# Patient Record
Sex: Female | Born: 1970 | Race: Asian | Hispanic: No | Marital: Married | Smoking: Never smoker
Health system: Southern US, Community
[De-identification: ages and names within clinical notes are randomized; demographics above are authoritative.]

---

## 1999-05-26 ENCOUNTER — Other Ambulatory Visit: Admission: RE | Admit: 1999-05-26 | Discharge: 1999-05-26 | Payer: Self-pay | Admitting: Family Medicine

## 2000-02-25 ENCOUNTER — Other Ambulatory Visit: Admission: RE | Admit: 2000-02-25 | Discharge: 2000-02-25 | Payer: Self-pay | Admitting: Obstetrics and Gynecology

## 2000-09-28 ENCOUNTER — Inpatient Hospital Stay (HOSPITAL_COMMUNITY): Admission: AD | Admit: 2000-09-28 | Discharge: 2000-09-30 | Payer: Self-pay | Admitting: Obstetrics and Gynecology

## 2000-10-01 ENCOUNTER — Encounter: Admission: RE | Admit: 2000-10-01 | Discharge: 2000-10-31 | Payer: Self-pay | Admitting: Obstetrics and Gynecology

## 2000-10-18 ENCOUNTER — Encounter (INDEPENDENT_AMBULATORY_CARE_PROVIDER_SITE_OTHER): Payer: Self-pay | Admitting: *Deleted

## 2000-10-18 ENCOUNTER — Other Ambulatory Visit: Admission: RE | Admit: 2000-10-18 | Discharge: 2000-10-18 | Payer: Self-pay | Admitting: Obstetrics and Gynecology

## 2000-10-21 ENCOUNTER — Encounter: Payer: Self-pay | Admitting: Obstetrics and Gynecology

## 2000-10-21 ENCOUNTER — Encounter (INDEPENDENT_AMBULATORY_CARE_PROVIDER_SITE_OTHER): Payer: Self-pay

## 2000-10-21 ENCOUNTER — Inpatient Hospital Stay (HOSPITAL_COMMUNITY): Admission: AD | Admit: 2000-10-21 | Discharge: 2000-10-21 | Payer: Self-pay | Admitting: Obstetrics and Gynecology

## 2000-10-21 ENCOUNTER — Ambulatory Visit (HOSPITAL_COMMUNITY): Admission: RE | Admit: 2000-10-21 | Discharge: 2000-10-21 | Payer: Self-pay | Admitting: Obstetrics and Gynecology

## 2000-10-26 ENCOUNTER — Other Ambulatory Visit: Admission: RE | Admit: 2000-10-26 | Discharge: 2000-10-26 | Payer: Self-pay | Admitting: Obstetrics and Gynecology

## 2000-11-01 ENCOUNTER — Encounter: Admission: RE | Admit: 2000-11-01 | Discharge: 2000-12-01 | Payer: Self-pay | Admitting: Obstetrics and Gynecology

## 2001-09-01 ENCOUNTER — Other Ambulatory Visit: Admission: RE | Admit: 2001-09-01 | Discharge: 2001-09-01 | Payer: Self-pay | Admitting: Obstetrics and Gynecology

## 2002-09-03 ENCOUNTER — Other Ambulatory Visit: Admission: RE | Admit: 2002-09-03 | Discharge: 2002-09-03 | Payer: Self-pay | Admitting: *Deleted

## 2003-09-06 ENCOUNTER — Other Ambulatory Visit: Admission: RE | Admit: 2003-09-06 | Discharge: 2003-09-06 | Payer: Self-pay | Admitting: Obstetrics and Gynecology

## 2006-02-08 ENCOUNTER — Ambulatory Visit (HOSPITAL_COMMUNITY): Admission: RE | Admit: 2006-02-08 | Discharge: 2006-02-08 | Payer: Self-pay | Admitting: Obstetrics and Gynecology

## 2006-02-08 ENCOUNTER — Encounter (INDEPENDENT_AMBULATORY_CARE_PROVIDER_SITE_OTHER): Payer: Self-pay | Admitting: Specialist

## 2008-07-25 ENCOUNTER — Encounter: Payer: Self-pay | Admitting: Internal Medicine

## 2008-08-01 ENCOUNTER — Encounter: Payer: Self-pay | Admitting: Internal Medicine

## 2008-08-07 ENCOUNTER — Ambulatory Visit: Payer: Self-pay | Admitting: Internal Medicine

## 2008-08-07 DIAGNOSIS — K146 Glossodynia: Secondary | ICD-10-CM | POA: Insufficient documentation

## 2008-08-07 DIAGNOSIS — R12 Heartburn: Secondary | ICD-10-CM | POA: Insufficient documentation

## 2008-08-07 DIAGNOSIS — J029 Acute pharyngitis, unspecified: Secondary | ICD-10-CM | POA: Insufficient documentation

## 2008-08-09 ENCOUNTER — Encounter: Payer: Self-pay | Admitting: Internal Medicine

## 2008-10-10 ENCOUNTER — Ambulatory Visit: Payer: Self-pay | Admitting: Internal Medicine

## 2008-10-10 DIAGNOSIS — R1013 Epigastric pain: Secondary | ICD-10-CM | POA: Insufficient documentation

## 2009-08-27 ENCOUNTER — Encounter: Payer: Self-pay | Admitting: Internal Medicine

## 2009-08-28 ENCOUNTER — Encounter: Payer: Self-pay | Admitting: Gastroenterology

## 2010-03-09 IMAGING — CT CT NECK W/O CM
2 series · 10 of 14 positions shown, 12 images · IV contrast (CONTRAST)
Comparison: NONE

CLINICAL DATA: Left-sided neck soreness and swelling. 

CT NECK WITHOUT AND WITH INTRAVENOUS CONTRAST
TECHNIQUE: A series of axial 5-mm-thick slices at 5-mm 
increments were obtained prior to intravenous contrast.  Following 
the intravenous administration of contrast, 3-mm-thick slices at 
3-mm increments were obtained.

[Series 2: without · axial · non-contrast · 0.49mm/px · z∈[-549,-424]mm · 3 of 51 slices shown]
[im 13/51  bone]
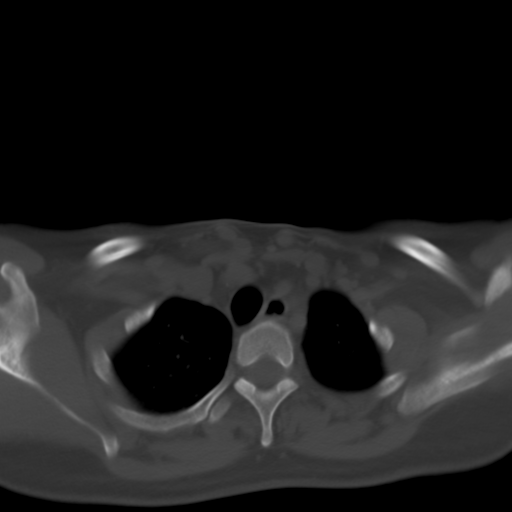
[im 26/51  bone]
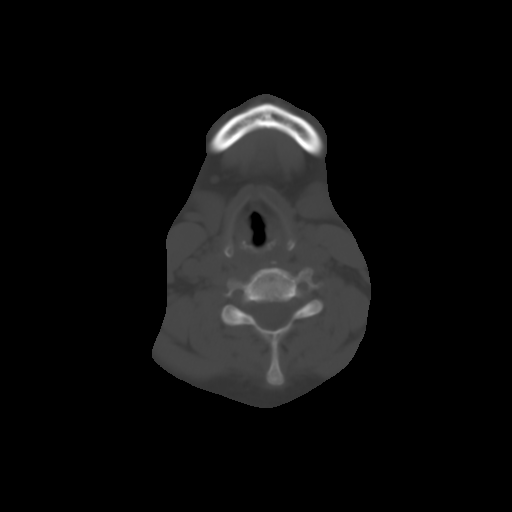
[im 38/51  bone]
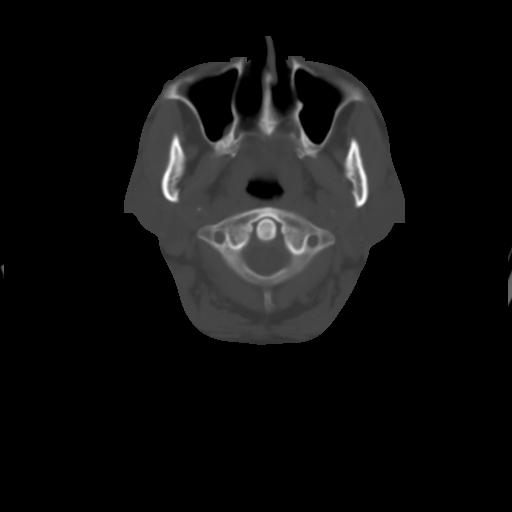

[Series 3: with contrast · axial · 0.49mm/px · z∈[-581,-392]mm · 7 of 85 slices shown, 9 images]
[im 11/85  soft-tissue]
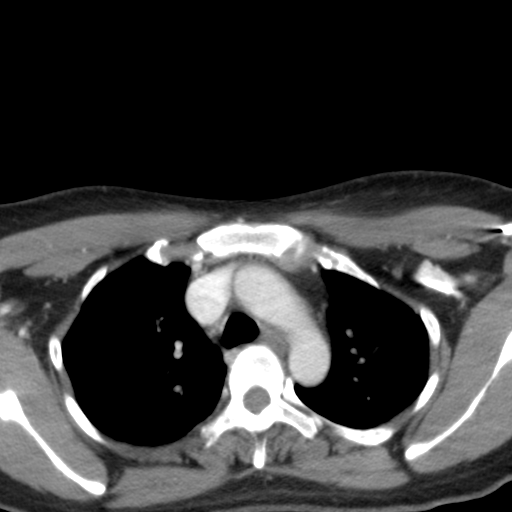
[im 11/85  bone]
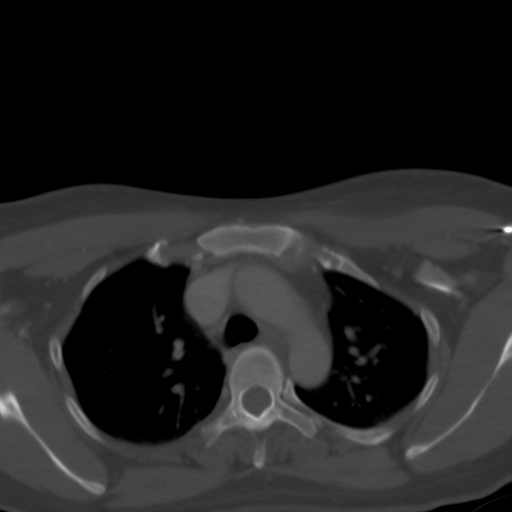
[im 22/85  bone]
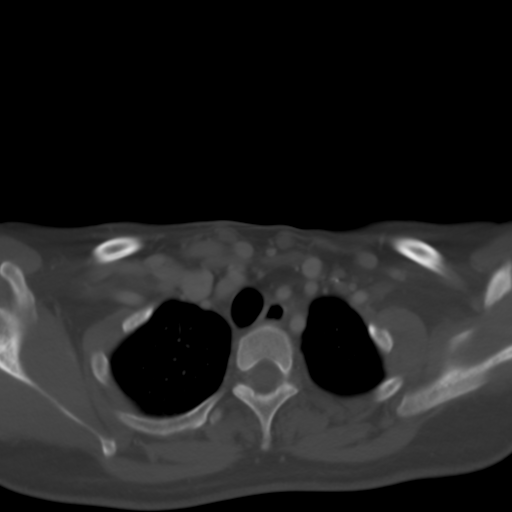
[im 32/85  bone]
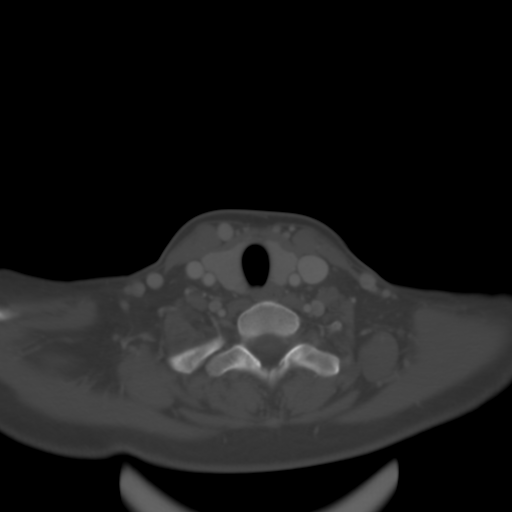
[im 43/85  bone]
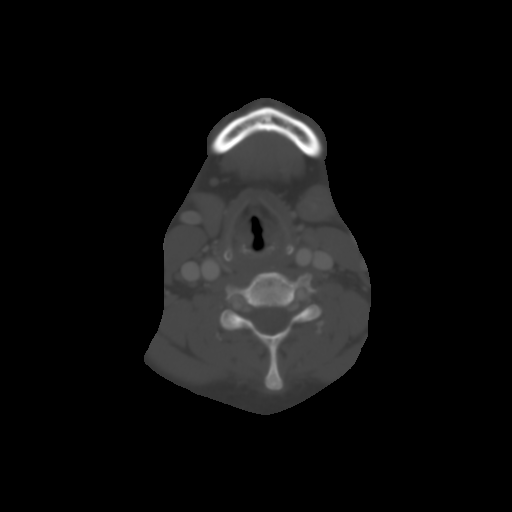
[im 53/85  soft-tissue]
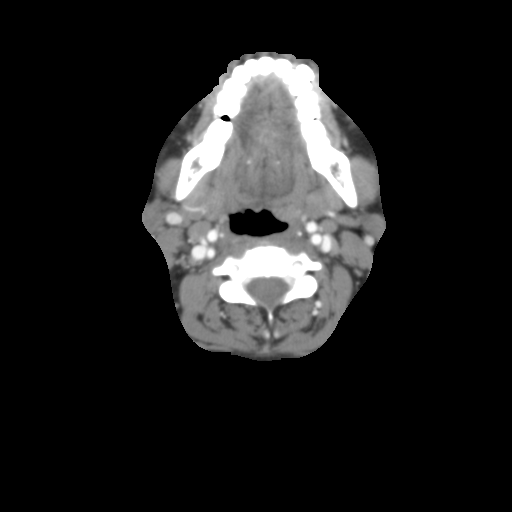
[im 53/85  bone]
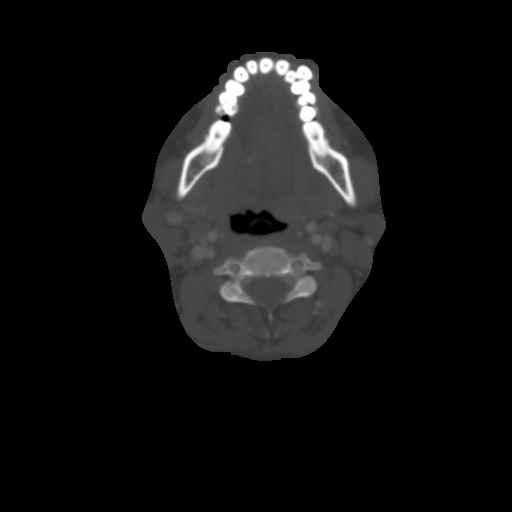
[im 64/85  bone]
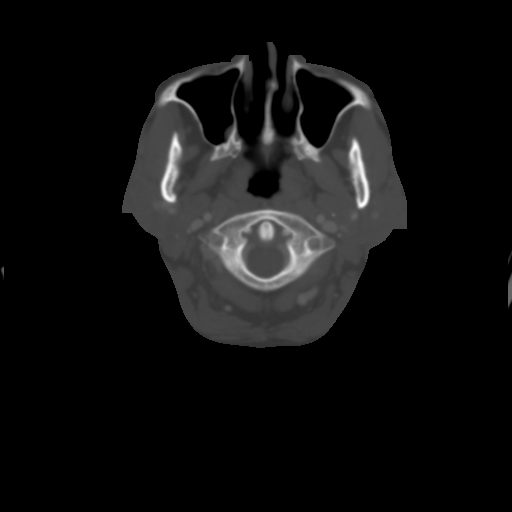
[im 74/85  bone]
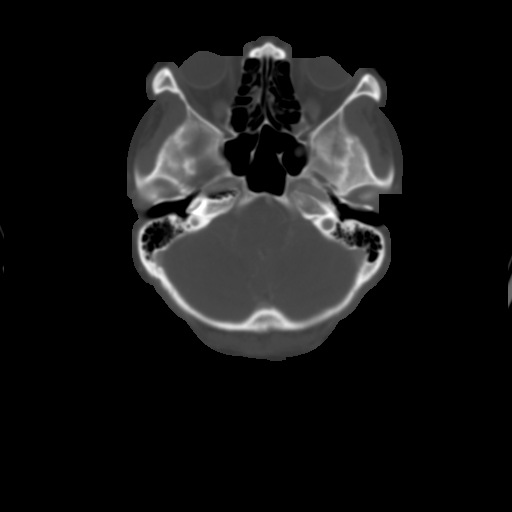

[10 of 14 positions shown; findings below may reference images not displayed]

FINDINGS: No evidence of nasopharyngeal, oropharyngeal, or 
hypopharyngeal mass.  No supraglottic, glottic, or infraglottic 
masses.  No evidence of parotid or submandibular gland mass or 
calculus. There are scattered lymph nodes in the anterior and 
posterior cervical region, most of which are less than 1.0 cm in 
diameter.  There is a lymph node on the right beneath the 
sternocleidomastoid and just in front of the carotid and internal 
jugular veins, which measures approximately 10 mm.  No coalescent 
mass or adenopathy is noted.  Thyroid is unremarkable.  No 
anterior superior mediastinal or apical lung masses.  No lytic or 
blastic lesions are identified.
IMPRESSION: Mild reactive adenopathy in the neck without evidence 
of large mass or coalescent adenopathy. Vance, Manuel 
electronically reviewed on 04/16/2008 Dict Date: 04/16/2008  Tran 
Date:  04/16/2008 DAS  JLM

## 2010-06-09 NOTE — Medication Information (Signed)
Summary: Coverage Review Request for Aciphex/[employer]  Coverage Review Request for Aciphex/[employer]   Imported By: Sherian Rein 09/29/2009 09:19:37  _____________________________________________________________________  External Attachment:    Type:   Image     Comment:   External Document

## 2010-06-09 NOTE — Medication Information (Signed)
Summary: Aciphex Approved/[employer]  Aciphex Approved/[employer]   Imported By: Sherian Rein 09/02/2009 14:24:47  _____________________________________________________________________  External Attachment:    Type:   Image     Comment:   External Document

## 2010-09-25 NOTE — H&P (Signed)
[facility]  PatientVIKA, BUSKE                              MRN: [id number] Adm. Date:  09/28/00 Attending:  Silverio Lay, M.D.                         History and Physical  DATE OF BIRTH:                16-Sep-1970  REASON FOR ADMISSION:         Intrauterine pregnancy at 39 weeks and 5 days and spontaneous labor.  HISTORY OF PRESENT ILLNESS:   This is a 40 year old [place] patient, gravida 1, para 0, abortus 0, with a due date of Sep 30, 2000, being admitted for regular uterine contractions since midnight today.  She is currently contracting every 2 to 3 minutes, lasting 60 seconds, intensity 8/10.  She denies any leaking of fluid, denies any bleeding, reports good fetal activity.  Prenatal course reveals blood type B-positive, RPR nonreactive, rubella immune, HBsAg negative, HIV nonreactive, gonorrhea negative, Chlamydia negative, first trimester ultrasound changed due date to Sep 30, 2000, 16-week AFP within normal limits, 20-week ultrasound with normal anatomy survey, normal anterior placenta, 28-week glucose tolerance test within normal limits and 35-week group B strep is negative.  Prenatal course was otherwise uneventful.  ALLERGIES:                    No known drug allergies.  FAMILY HISTORY:               Father deceased of lung cancer.  SOCIAL HISTORY:               Single.  Father of the baby supportive. Nonsmoker.  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  PHYSICAL EXAMINATION:  VITAL SIGNS:                  Normal.  HEENT:                        Negative.  LUNGS:                        Clear.  HEART:                        Normal.  ABDOMEN:                      Gravid, fundal height 40 cm, vertex presentation by Leopolds maneuver.  PELVIC:                       Vaginal exam 3+ cm, 90% effaced, bulging bag of membrane, vertex -1.  FETAL HEART TRACING:          Now very active and reassuring but at arrival, baby  had a prolonged variable decelerations to 80 beats per minute, which were rapidly resolved with IV bolus and left lateral decubitus.  ASSESSMENT:                   Intrauterine pregnancy at 39 weeks and 5 days, in active labor.  PLAN:  Admit to L&D.  Pain management as needed. Epidural as needed.  Spontaneous vaginal delivery expected. DD:  09/28/00 TD:  09/28/00 Job: [id number]

## 2010-09-25 NOTE — Op Note (Signed)
[facility] of [facility]  PatientJAELYN, Dominique Black                              MRN: [id number] Proc. Date: 10/21/00 Adm. Date:  [id number] Attending:  Silverio Lay A                           Operative Report  PREOPERATIVE DIAGNOSIS:       Possible retained placenta, three weeks postpartum.  POSTOPERATIVE DIAGNOSIS:      Possible retained placenta, three weeks postpartum.  PROCEDURE:                    Dilation and curettage.  SURGEON:                      Silverio Lay, M.D.  ANESTHESIA:                   General with paracervical block, was first intended for MAC.  ESTIMATED BLOOD LOSS:         50 cc.  DESCRIPTION OF PROCEDURE:     After being informed of the planned procedure with possible risks and complications including bleeding, infection, and uterine perforation with the possible need of laparoscopy, and this consent was done in the office with an interpreter, informed consent is obtained. The patient is taken to OR #4 and given MAC sedation. She is placed in a lithotomy position, prepped and draped in a sterile fashion. A speculum is inserted, anterior lip of the cervix is grasped with a tenaculum and paracervical block is done using Nesacaine 1% 10 cc in usual fashion. Due to her great sensitivity to fentanyl and difficulty breathing, anesthesia was converted to general with laryngeal mask. Uterus was sounded at 10.5 cm and the cervix easily let a 35 gauge Pratt dilator go through. Using a large curet, we proceeded with sharp curettage of all uterine walls which removed necrotic-appearing decidua. No frank products of conception, no frank placenta. At the end of the procedure, all walls were felt to be free of tissue and bleeding was minimal. We removed instruments, applied some pressure at the site of the tenaculum clamp, obtained good hemostasis, then removed all instruments. Instrument and sponge count is complete x 2. Estimated blood loss is 50  cc. The procedure is well tolerated by the patient who is taken to recovery room in a well and stable condition. DD:  10/21/00 TD:  10/21/00 Job: [id number]

## 2010-09-25 NOTE — Op Note (Signed)
NAME:  Dominique Black, Dominique Black                     ACCOUNT NO.:  [id number]   MEDICAL RECORD NO.:  [id number]          PATIENT TYPE:  AMB   LOCATION:  [facility]                           FACILITY:  [facility]   PHYSICIAN:  Lenoard Aden, M.D.DATE OF BIRTH:  1970/11/01   DATE OF PROCEDURE:  02/08/2006  DATE OF DISCHARGE:                                 OPERATIVE REPORT   PREOPERATIVE DIAGNOSIS:  Missed abortion at 7 weeks.   POSTOPERATIVE DIAGNOSIS:  Missed abortion at 7 weeks.   PROCEDURE:  Suction dilatation and evacuation.   SURGEON:  Lenoard Aden, M.D.   ANESTHESIA:  MAC and paracervical.   ESTIMATED BLOOD LOSS:  Less than 50 mL.   COMPLICATIONS:  None.   SPECIMENS:  Products of conception to pathology.   DISPOSITION:  Patient to recovery room in good condition.   BRIEF OPERATIVE NOTE:  Being appraised of the risks of anesthesia,  infection, bleeding, injury to abdominal organs, need for repair of delayed  versus immediate complications to include bowel or bladder injury, she is  brought to the operating room where she was administered IV sedation without  difficulty, prepped and draped in the usual sterile fashion, catheterized  until the bladder was empty.  Examination under anesthesia reveals a bulky,  mid position, 6-week size uterus, no adnexal masses.  Paracervical block  using a dilute Xylocaine solution is placed, 20 mL total.  Cervix easily  dilated up to #21 Pratt dilator.  The 7-mm suction curette placed.  Visualization upon suction reveals products of conception which are noted.  Repeat suction and curettage in a 4-quadrant method reveals the cavity to be  empty.  Good hemostasis noted.  All instruments removed.  The patient  tolerated this procedure well and is transferred to recovery in good  condition.      Lenoard Aden, M.D.  Electronically Signed     RJT/MEDQ  D:  02/08/2006  T:  02/09/2006  Job:  [id number]

## 2013-04-09 ENCOUNTER — Ambulatory Visit (INDEPENDENT_AMBULATORY_CARE_PROVIDER_SITE_OTHER): Payer: BC Managed Care – PPO | Admitting: Family Medicine

## 2013-04-09 VITALS — BP 110/60 | HR 60 | Temp 98.3°F | Resp 16 | Ht 60.0 in | Wt 119.2 lb

## 2013-04-09 DIAGNOSIS — H9202 Otalgia, left ear: Secondary | ICD-10-CM

## 2013-04-09 DIAGNOSIS — L299 Pruritus, unspecified: Secondary | ICD-10-CM

## 2013-04-09 DIAGNOSIS — H9209 Otalgia, unspecified ear: Secondary | ICD-10-CM

## 2013-04-09 MED ORDER — NAPROXEN 500 MG PO TABS: 500.0000 mg | ORAL_TABLET | Freq: Two times a day (BID) | ORAL | Status: DC

## 2013-04-09 MED ORDER — NEOMYCIN-POLYMYXIN-HC 3.5-10000-1 OT SOLN: 3.0000 [drp] | Freq: Four times a day (QID) | OTIC | Status: DC

## 2013-04-09 NOTE — Patient Instructions (Signed)
Use 3 drops in your left ear 3 or 4 times daily  Take naproxen 500 mg one twice daily with breakfast and with supper for pain and inflammation  Return if worse or not improving

## 2013-04-09 NOTE — Progress Notes (Signed)
Subjective: 42 year old lady who is well-known to me. She has not been here for a couple of years. She has been having problems for the last week or 2 with itching and a fullness discomfort in her left ear. It is been bothering her a lot. Has not had any upper respiratory infection symptoms.  Objective: Her TMs are entirely normal. Canals look okay. No pain with movement of the pinna. No significant nodes. TMJs intact. Throat clear.  Assessment: Itching and discomfort left ear  Plan: Cortisporin Otic Naproxen for inflammation Return if worse

## 2015-09-04 ENCOUNTER — Ambulatory Visit (INDEPENDENT_AMBULATORY_CARE_PROVIDER_SITE_OTHER): Payer: BLUE CROSS/BLUE SHIELD | Admitting: Family Medicine

## 2015-09-04 ENCOUNTER — Encounter: Payer: Self-pay | Admitting: Family Medicine

## 2015-09-04 VITALS — BP 128/70 | HR 95 | Temp 98.8°F | Ht 60.5 in | Wt 129.0 lb

## 2015-09-04 DIAGNOSIS — R05 Cough: Secondary | ICD-10-CM | POA: Diagnosis not present

## 2015-09-04 DIAGNOSIS — R0989 Other specified symptoms and signs involving the circulatory and respiratory systems: Secondary | ICD-10-CM

## 2015-09-04 DIAGNOSIS — J989 Respiratory disorder, unspecified: Secondary | ICD-10-CM | POA: Diagnosis not present

## 2015-09-04 DIAGNOSIS — T7840XA Allergy, unspecified, initial encounter: Secondary | ICD-10-CM

## 2015-09-04 DIAGNOSIS — R059 Cough, unspecified: Secondary | ICD-10-CM

## 2015-09-04 MED ORDER — BENZONATATE 100 MG PO CAPS
100.0000 mg | ORAL_CAPSULE | Freq: Three times a day (TID) | ORAL | Status: DC | PRN
Start: 2015-09-04 — End: 2017-05-25

## 2015-09-04 MED ORDER — MONTELUKAST SODIUM 10 MG PO TABS: 10.0000 mg | ORAL_TABLET | Freq: Every day | ORAL | Status: DC

## 2015-09-04 MED ORDER — ALBUTEROL SULFATE HFA 108 (90 BASE) MCG/ACT IN AERS: 2.0000 | INHALATION_SPRAY | RESPIRATORY_TRACT | Status: DC | PRN

## 2015-09-04 MED ORDER — BENZONATATE 100 MG PO CAPS: 100.0000 mg | ORAL_CAPSULE | Freq: Three times a day (TID) | ORAL | Status: DC | PRN

## 2015-09-04 NOTE — Progress Notes (Signed)
Patient ID: Dominique Black, female    DOB: [birth year]  Age: 45 y.o. MRN: [id number]  Chief Complaint  Patient presents with  . Cough    x 1 week  . Nasal Congestion  . Shortness of Breath    Subjective:   Pleasant lady who is been having a lot of problems with allergies over the last week or more. She cleaned her garden, and that's when she things really got bad. She has had some headache congestion and a little drainage from the nose. No sore throat or ear pain. No fevers. She has had chest congestion and some coughing. She does not smoke. She works sewing.    Current allergies, medications, problem list, past/family and social histories reviewed.  Objective:  BP 128/70 mmHg  Pulse 95  Temp(Src) 98.8 F (37.1 C) (Oral)  Ht 5' 0.5" (1.537 m)  Wt 129 lb (58.514 kg)  BMI 24.77 kg/m2  SpO2 99%  LMP 08/17/2015 (Exact Date)  No major acute distress. TMs are normal. Nose mildly congested. Throat has some posterior erythema from drainage. Neck supple without significant nodes. Chest is currently clear to auscultation though it sounds like she has had some wheezing. Heart regular without murmurs.  Assessment & Plan:   Assessment: 1. Cough   2. Chest congestion   3. Allergic disorder of respiratory system, initial encounter       Plan: Treat symptomatically. No major testing needed at this time.  No orders of the defined types were placed in this encounter.    Meds ordered this encounter  Medications  . albuterol (PROVENTIL HFA;VENTOLIN HFA) 108 (90 Base) MCG/ACT inhaler    Sig: Inhale 2 puffs into the lungs every 4 (four) hours as needed for wheezing or shortness of breath (cough, shortness of breath or wheezing.).    Dispense:  1 Inhaler    Refill:  1  . montelukast (SINGULAIR) 10 MG tablet    Sig: Take 1 tablet (10 mg total) by mouth at bedtime.    Dispense:  30 tablet    Refill:  3         Patient Instructions   Take montelukast 1 each evening for allergy  Take the  benzonatate cough pills one or 2 pills 3 times daily as needed for daytime cough  Use the albuterol inhaler 2 inhalations every 4-6 hours as needed for congestion in the chest  Also take an over-the-counter loratadine or fexofenadine (Claritin or Allegra) 1 daily  Return if worse or not improving  I am retiring, but other doctors and providers will be here still that you can see when you walk in. Allergic Rhinitis Allergic rhinitis is when the mucous membranes in the nose respond to allergens. Allergens are particles in the air that cause your body to have an allergic reaction. This causes you to release allergic antibodies. Through a chain of events, these eventually cause you to release histamine into the blood stream. Although meant to protect the body, it is this release of histamine that causes your discomfort, such as frequent sneezing, congestion, and an itchy, runny nose.  CAUSES Seasonal allergic rhinitis (hay fever) is caused by pollen allergens that may come from grasses, trees, and weeds. Year-round allergic rhinitis (perennial allergic rhinitis) is caused by allergens such as house dust mites, pet dander, and mold spores. SYMPTOMS  Nasal stuffiness (congestion).  Itchy, runny nose with sneezing and tearing of the eyes. DIAGNOSIS Your health care provider can help you determine the allergen or allergens that  trigger your symptoms. If you and your health care provider are unable to determine the allergen, skin or blood testing may be used. Your health care provider will diagnose your condition after taking your health history and performing a physical exam. Your health care provider may assess you for other related conditions, such as asthma, pink eye, or an ear infection. TREATMENT Allergic rhinitis does not have a cure, but it can be controlled by:  Medicines that block allergy symptoms. These may include allergy shots, nasal sprays, and oral antihistamines.  Avoiding the  allergen. Hay fever may often be treated with antihistamines in pill or nasal spray forms. Antihistamines block the effects of histamine. There are over-the-counter medicines that may help with nasal congestion and swelling around the eyes. Check with your health care provider before taking or giving this medicine. If avoiding the allergen or the medicine prescribed do not work, there are many new medicines your health care provider can prescribe. Stronger medicine may be used if initial measures are ineffective. Desensitizing injections can be used if medicine and avoidance does not work. Desensitization is when a patient is given ongoing shots until the body becomes less sensitive to the allergen. Make sure you follow up with your health care provider if problems continue. HOME CARE INSTRUCTIONS It is not possible to completely avoid allergens, but you can reduce your symptoms by taking steps to limit your exposure to them. It helps to know exactly what you are allergic to so that you can avoid your specific triggers. SEEK MEDICAL CARE IF:  You have a fever.  You develop a cough that does not stop easily (persistent).  You have shortness of breath.  You start wheezing.  Symptoms interfere with normal daily activities.   This information is not intended to replace advice given to you by your health care provider. Make sure you discuss any questions you have with your health care provider.   Document Released: 01/19/2001 Document Revised: 05/17/2014 Document Reviewed: 01/01/2013 Elsevier Interactive Patient Education 2016 [employer].    IF you received an x-ray today, you will receive an invoice from [facility]. Please contact [facility] at [phone] with questions or concerns regarding your invoice.   IF you received labwork today, you will receive an invoice from [employer]. Please contact [facility] at [phone] with questions or  concerns regarding your invoice.   Our billing staff will not be able to assist you with questions regarding bills from these companies.  You will be contacted with the lab results as soon as they are available. The fastest way to get your results is to activate your My Chart account. Instructions are located on the last page of this paperwork. If you have not heard from [place] regarding the results in 2 weeks, please contact this office.          Return if symptoms worsen or fail to improve.   Nashla Althoff, MD 09/04/2015

## 2015-09-04 NOTE — Patient Instructions (Addendum)
Take montelukast 1 each evening for allergy  Take the benzonatate cough pills one or 2 pills 3 times daily as needed for daytime cough  Use the albuterol inhaler 2 inhalations every 4-6 hours as needed for congestion in the chest  Also take an over-the-counter loratadine or fexofenadine or cetirizine (Claritin or Allegra or Zyrtec) 1 daily  Return if worse or not improving  I am retiring, but other doctors and providers will be here still that you can see when you walk in.  Allergic Rhinitis Allergic rhinitis is when the mucous membranes in the nose respond to allergens. Allergens are particles in the air that cause your body to have an allergic reaction. This causes you to release allergic antibodies. Through a chain of events, these eventually cause you to release histamine into the blood stream. Although meant to protect the body, it is this release of histamine that causes your discomfort, such as frequent sneezing, congestion, and an itchy, runny nose.  CAUSES Seasonal allergic rhinitis (hay fever) is caused by pollen allergens that may come from grasses, trees, and weeds. Year-round allergic rhinitis (perennial allergic rhinitis) is caused by allergens such as house dust mites, pet dander, and mold spores. SYMPTOMS  Nasal stuffiness (congestion).  Itchy, runny nose with sneezing and tearing of the eyes. DIAGNOSIS Your health care provider can help you determine the allergen or allergens that trigger your symptoms. If you and your health care provider are unable to determine the allergen, skin or blood testing may be used. Your health care provider will diagnose your condition after taking your health history and performing a physical exam. Your health care provider may assess you for other related conditions, such as asthma, pink eye, or an ear infection. TREATMENT Allergic rhinitis does not have a cure, but it can be controlled by:  Medicines that block allergy symptoms. These may  include allergy shots, nasal sprays, and oral antihistamines.  Avoiding the allergen. Hay fever may often be treated with antihistamines in pill or nasal spray forms. Antihistamines block the effects of histamine. There are over-the-counter medicines that may help with nasal congestion and swelling around the eyes. Check with your health care provider before taking or giving this medicine. If avoiding the allergen or the medicine prescribed do not work, there are many new medicines your health care provider can prescribe. Stronger medicine may be used if initial measures are ineffective. Desensitizing injections can be used if medicine and avoidance does not work. Desensitization is when a patient is given ongoing shots until the body becomes less sensitive to the allergen. Make sure you follow up with your health care provider if problems continue. HOME CARE INSTRUCTIONS It is not possible to completely avoid allergens, but you can reduce your symptoms by taking steps to limit your exposure to them. It helps to know exactly what you are allergic to so that you can avoid your specific triggers. SEEK MEDICAL CARE IF:  You have a fever.  You develop a cough that does not stop easily (persistent).  You have shortness of breath.  You start wheezing.  Symptoms interfere with normal daily activities.   This information is not intended to replace advice given to you by your health care provider. Make sure you discuss any questions you have with your health care provider.   Document Released: 01/19/2001 Document Revised: 05/17/2014 Document Reviewed: 01/01/2013 Elsevier Interactive Patient Education [employer]2016 Elsevier Inc.    IF you received an x-ray today, you will receive an invoice from  [facility]. Please contact [facility] at [phone] with questions or concerns regarding your invoice.   IF you received labwork today, you will receive an invoice from [employer]. Please contact [facility] at [phone] with questions or concerns regarding your invoice.   Our billing staff will not be able to assist you with questions regarding bills from these companies.  You will be contacted with the lab results as soon as they are available. The fastest way to get your results is to activate your My Chart account. Instructions are located on the last page of this paperwork. If you have not heard from [place] regarding the results in 2 weeks, please contact this office.

## 2016-08-04 ENCOUNTER — Emergency Department (HOSPITAL_COMMUNITY): Payer: BLUE CROSS/BLUE SHIELD

## 2016-08-04 ENCOUNTER — Encounter (HOSPITAL_COMMUNITY): Payer: Self-pay | Admitting: Emergency Medicine

## 2016-08-04 ENCOUNTER — Emergency Department (HOSPITAL_COMMUNITY)
Admission: EM | Admit: 2016-08-04 | Discharge: 2016-08-04 | Disposition: A | Discharge status: Home / Self Care | Payer: BLUE CROSS/BLUE SHIELD | Attending: Emergency Medicine | Admitting: Emergency Medicine

## 2016-08-04 DIAGNOSIS — M542 Cervicalgia: Secondary | ICD-10-CM | POA: Insufficient documentation

## 2016-08-04 DIAGNOSIS — Y939 Activity, unspecified: Secondary | ICD-10-CM | POA: Insufficient documentation

## 2016-08-04 DIAGNOSIS — G44319 Acute post-traumatic headache, not intractable: Secondary | ICD-10-CM | POA: Diagnosis not present

## 2016-08-04 DIAGNOSIS — G44309 Post-traumatic headache, unspecified, not intractable: Secondary | ICD-10-CM

## 2016-08-04 DIAGNOSIS — Y92410 Unspecified street and highway as the place of occurrence of the external cause: Secondary | ICD-10-CM | POA: Insufficient documentation

## 2016-08-04 DIAGNOSIS — Y999 Unspecified external cause status: Secondary | ICD-10-CM | POA: Diagnosis not present

## 2016-08-04 MED ORDER — IBUPROFEN 200 MG PO TABS
600.0000 mg | ORAL_TABLET | Freq: Once | ORAL | Status: DC
Start: 2016-08-04 — End: 2016-08-04
  Filled 2016-08-04: qty 3

## 2016-08-04 MED ORDER — IBUPROFEN 800 MG PO TABS: 800.0000 mg | ORAL_TABLET | Freq: Three times a day (TID) | ORAL | 0 refills | Status: DC

## 2016-08-04 MED ORDER — METHOCARBAMOL 500 MG PO TABS: 500.0000 mg | ORAL_TABLET | Freq: Two times a day (BID) | ORAL | 0 refills | Status: DC
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
## 2016-08-04 NOTE — ED Notes (Signed)
Bed: WTR8 Expected date:  Expected time:  Means of arrival:  Comments: 
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
## 2016-08-04 NOTE — Discharge Instructions (Signed)
Return if any problems.  Schedule to see the Orthopaedist for evaluation if pain persist

## 2016-09-29 ENCOUNTER — Other Ambulatory Visit: Payer: Self-pay | Admitting: Family Medicine

## 2016-09-29 DIAGNOSIS — R059 Cough, unspecified: Secondary | ICD-10-CM

## 2016-09-29 DIAGNOSIS — J989 Respiratory disorder, unspecified: Secondary | ICD-10-CM

## 2016-09-29 DIAGNOSIS — R05 Cough: Secondary | ICD-10-CM

## 2016-09-29 DIAGNOSIS — R0989 Other specified symptoms and signs involving the circulatory and respiratory systems: Secondary | ICD-10-CM

## 2016-09-29 DIAGNOSIS — T7840XA Allergy, unspecified, initial encounter: Secondary | ICD-10-CM

## 2017-05-18 ENCOUNTER — Ambulatory Visit: Payer: BLUE CROSS/BLUE SHIELD | Admitting: Physician Assistant

## 2017-05-18 ENCOUNTER — Encounter: Payer: Self-pay | Admitting: Physician Assistant

## 2017-05-18 VITALS — BP 128/86 | HR 93 | Temp 98.4°F | Resp 16 | Ht 61.0 in | Wt 134.0 lb

## 2017-05-18 DIAGNOSIS — J3489 Other specified disorders of nose and nasal sinuses: Secondary | ICD-10-CM

## 2017-05-18 MED ORDER — MUPIROCIN 2 % EX OINT: 1.0000 "application " | TOPICAL_OINTMENT | Freq: Two times a day (BID) | CUTANEOUS | 1 refills | Status: DC

## 2017-05-18 MED ORDER — CLINDAMYCIN HCL 300 MG PO CAPS: 300.0000 mg | ORAL_CAPSULE | Freq: Three times a day (TID) | ORAL | 0 refills | Status: DC

## 2017-05-18 NOTE — Patient Instructions (Addendum)
Please take medication as prescribed, and apply the topical cream.   Impetigo, Adult Impetigo is an infection of the skin. It commonly occurs in young children, but it can also occur in adults. The infection causes itchy blisters and sores that produce brownish-yellow fluid. As the fluid dries, it forms a thick, honey-colored crust. These skin changes usually occur on the face but can also affect other areas of the body. Impetigo usually goes away in 7-10 days with treatment. What are the causes? Impetigo is caused by two types of bacteria. It may be caused by staphylococci or streptococci bacteria. These bacteria cause impetigo when they get under the surface of the skin. This often happens after some damage to the skin, such as damage from:  Cuts, scrapes, or scratches.  Insect bites, especially when you scratch the area of a bite.  Chickenpox or other illnesses that cause open skin sores.  Nail biting or chewing.  Impetigo is contagious and can spread easily from one person to another. This may occur through close skin contact or by sharing towels, clothing, or other items with a person who has the infection. What increases the risk? Some things that can increase the risk of getting this infection include:  Playing sports that include skin-to-skin contact with others.  Having a skin condition with open sores.  Having many skin cuts or scrapes.  Living in an area that has high humidity levels.  Having poor hygiene.  Having high levels of staphylococci in your nose.  What are the signs or symptoms? Impetigo usually starts out as small blisters, often on the face. The blisters then break open and turn into tiny sores (lesions) with a yellow crust. In some cases, the blisters cause itching or burning. With scratching, irritation, or lack of treatment, these small lesions may get larger. Scratching can also cause impetigo to spread to other parts of the body. The bacteria can get under  the fingernails and spread when you touch another area of your skin. Other possible symptoms include:  Larger blisters.  Pus.  Swollen lymph glands.  How is this diagnosed? This condition is usually diagnosed during a physical exam. A skin sample or sample of fluid from a blister may be taken for lab tests that involve growing bacteria (culture test). This can help confirm the diagnosis or help determine the best treatment. How is this treated? Mild impetigo can be treated with prescription antibiotic cream. Oral antibiotic medicine may be used in more severe cases. Medicines for itching may also be used. Follow these instructions at home:  Take medicines only as directed by your health care provider.  To help prevent impetigo from spreading to other body areas: ? Keep your fingernails short and clean. ? Do not scratch the blisters or sores. ? Cover infected areas, if necessary, to keep from scratching.  Gently wash the infected areas with antibiotic soap and water.  Soak crusted areas in warm, soapy water using antibiotic soap. ? Gently rub the areas to remove crusts. Do not scrub.  Wash your hands often to avoid spreading this infection.  Stay home until you have used an antibiotic cream for 48 hours (2 days) or an oral antibiotic medicine for 24 hours (1 day). You should only return to work and activities with other people if your skin shows significant improvement. How is this prevented? To keep the infection from spreading:  Stay home until you have used an antibiotic cream for 48 hours or an oral antibiotic for  24 hours.  Wash your hands often.  Do not engage in skin-to-skin contact with other people while you have still have blisters.  Do not share towels, washcloths, or bedding with others while you have the infection.  Contact a health care provider if:  You develop more blisters or sores despite treatment.  Other family members get sores.  Your skin sores are  not improving after 48 hours of treatment.  You have a fever. Get help right away if:  You see spreading redness or swelling of the skin around your sores.  You see red streaks coming from your sores.  You develop a sore throat. This information is not intended to replace advice given to you by your health care provider. Make sure you discuss any questions you have with your health care provider. Document Released: 05/17/2014 Document Revised: 10/02/2015 Document Reviewed: 04/09/2014 Elsevier Interactive Patient Education  2017 [employer]     IF you received an x-ray today, you will receive an invoice from [facility]. Please contact [facility] at [phone] with questions or concerns regarding your invoice.   IF you received labwork today, you will receive an invoice from [facility]. Please contact [facility] at [phone] with questions or concerns regarding your invoice.   Our billing staff will not be able to assist you with questions regarding bills from these companies.  You will be contacted with the lab results as soon as they are available. The fastest way to get your results is to activate your My Chart account. Instructions are located on the last page of this paperwork. If you have not heard from us regarding the results in 2 weeks, please contact this office.
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
## 2017-05-25 ENCOUNTER — Other Ambulatory Visit: Payer: Self-pay

## 2017-05-25 ENCOUNTER — Ambulatory Visit: Payer: BLUE CROSS/BLUE SHIELD | Admitting: Physician Assistant

## 2017-05-25 ENCOUNTER — Encounter: Payer: Self-pay | Admitting: Physician Assistant

## 2017-05-25 VITALS — BP 118/82 | HR 86 | Temp 98.2°F | Resp 16 | Ht 62.6 in | Wt 135.0 lb

## 2017-05-25 DIAGNOSIS — J3489 Other specified disorders of nose and nasal sinuses: Secondary | ICD-10-CM | POA: Diagnosis not present

## 2017-05-25 MED ORDER — CLINDAMYCIN HCL 300 MG PO CAPS: 300.0000 mg | ORAL_CAPSULE | Freq: Three times a day (TID) | ORAL | 0 refills | Status: AC
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
## 2017-05-25 NOTE — Patient Instructions (Addendum)
  Continue the clindamycin and the mupirocin ointment You can wash this area with soap and water.  You can use antibacterial soap or a gold dial soap.  However stray away from deodorant soap or highly fragranted soaps  IF you received an x-ray today, you will receive an invoice from [facility]. Please contact [facility] at [phone] with questions or concerns regarding your invoice.   IF you received labwork today, you will receive an invoice from [facility]. Please contact [facility] at [phone] with questions or concerns regarding your invoice.   Our billing staff will not be able to assist you with questions regarding bills from these companies.  You will be contacted with the lab results as soon as they are available. The fastest way to get your results is to activate your My Chart account. Instructions are located on the last page of this paperwork. If you have not heard from us regarding the results in 2 weeks, please contact this office.

## 2017-08-17 ENCOUNTER — Encounter: Payer: Self-pay | Admitting: Physician Assistant

## 2019-07-15 ENCOUNTER — Ambulatory Visit: Payer: BLUE CROSS/BLUE SHIELD | Attending: Internal Medicine

## 2019-07-15 DIAGNOSIS — Z23 Encounter for immunization: Secondary | ICD-10-CM | POA: Insufficient documentation

## 2019-07-15 NOTE — Progress Notes (Signed)
   Covid-19 Vaccination Clinic  Name:  Dominique Black    MRN: [id number] DOB: 05-04-1971  07/15/2019  Dominique Black was observed post Covid-19 immunization for 15 minutes without incident. She was provided with Vaccine Information Sheet and instruction to access the V-Safe system.   Dominique Black was instructed to call 911 with any severe reactions post vaccine: Marland Kitchen Difficulty breathing  . Swelling of face and throat  . A fast heartbeat  . A bad rash all over body  . Dizziness and weakness   Immunizations Administered    Name Date Dose VIS Date Route   Pfizer COVID-19 Vaccine 07/15/2019  5:15 PM 0.3 mL 04/20/2019 Intramuscular   Manufacturer: [employer], [employer]   Lot: [id number]   NDC: [address]-8

## 2019-08-14 ENCOUNTER — Ambulatory Visit: Payer: BLUE CROSS/BLUE SHIELD | Attending: Internal Medicine

## 2019-08-14 DIAGNOSIS — Z23 Encounter for immunization: Secondary | ICD-10-CM

## 2019-08-14 NOTE — Progress Notes (Signed)
   Covid-19 Vaccination Clinic  Name:  Dominique Black    MRN: [id number] DOB: Jun 09, 1970  08/14/2019  Ms. [name] was observed post Covid-19 immunization for 15 minutes without incident. She was provided with Vaccine Information Sheet and instruction to access the V-Safe system.   Ms. [name] was instructed to call 911 with any severe reactions post vaccine: Marland Kitchen Difficulty breathing  . Swelling of face and throat  . A fast heartbeat  . A bad rash all over body  . Dizziness and weakness   Immunizations Administered    Name Date Dose VIS Date Route   Pfizer COVID-19 Vaccine 08/14/2019  2:11 PM 0.3 mL 04/20/2019 Intramuscular   Manufacturer: [employer], [employer]   Lot: [id number]   NDC: [address]-0

## 2020-07-04 LAB — EXTERNAL GENERIC LAB PROCEDURE: COLOGUARD: POSITIVE — AB

## 2022-02-24 ENCOUNTER — Ambulatory Visit (HOSPITAL_COMMUNITY)
Admission: EM | Admit: 2022-02-24 | Discharge: 2022-02-24 | Disposition: A | Discharge status: Home / Self Care | Payer: Commercial Managed Care - HMO | Attending: Emergency Medicine | Admitting: Emergency Medicine

## 2022-02-24 ENCOUNTER — Encounter (HOSPITAL_COMMUNITY): Payer: Self-pay | Admitting: Emergency Medicine

## 2022-02-24 DIAGNOSIS — J989 Respiratory disorder, unspecified: Secondary | ICD-10-CM | POA: Diagnosis not present

## 2022-02-24 DIAGNOSIS — R0989 Other specified symptoms and signs involving the circulatory and respiratory systems: Secondary | ICD-10-CM

## 2022-02-24 DIAGNOSIS — T7840XA Allergy, unspecified, initial encounter: Secondary | ICD-10-CM | POA: Diagnosis not present

## 2022-02-24 DIAGNOSIS — J302 Other seasonal allergic rhinitis: Secondary | ICD-10-CM | POA: Diagnosis not present

## 2022-02-24 DIAGNOSIS — R059 Cough, unspecified: Secondary | ICD-10-CM

## 2022-02-24 MED ORDER — CETIRIZINE HCL 10 MG PO TABS: 10.0000 mg | ORAL_TABLET | Freq: Every day | ORAL | 0 refills | Status: DC

## 2022-02-24 MED ORDER — AZITHROMYCIN 250 MG PO TABS: 250.0000 mg | ORAL_TABLET | Freq: Every day | ORAL | 0 refills | Status: DC

## 2022-02-24 MED ORDER — ALBUTEROL SULFATE HFA 108 (90 BASE) MCG/ACT IN AERS: 2.0000 | INHALATION_SPRAY | RESPIRATORY_TRACT | 1 refills | Status: DC | PRN

## 2022-02-24 NOTE — Discharge Instructions (Signed)
Your symptoms are most likely related to your seasonal allergies however as they have been present for 2 weeks we will ensure that there is no bacterial aiding to your symptoms  Take azithromycin as directed for treatment of bacteria  Given Zyrtec every evening before bed to help alleviate some of your congestion  You may take 2 puffs of albuterol inhaler every 4 hours as needed for sensation of shortness of breath and to make it easier for you to breathe  You may continue use of Mucinex and nasal spray to help aid your symptoms    You can take Tylenol and/or Ibuprofen as needed for fever reduction and pain relief.  May Attempt any of the following if new symptoms occur   For cough: honey 1/2 to 1 teaspoon (you can dilute the honey in water or another fluid).  You can also use guaifenesin and dextromethorphan for cough. You can use a humidifier for chest congestion and cough.  If you don't have a humidifier, you can sit in the bathroom with the hot shower running.      For sore throat: try warm salt water gargles, cepacol lozenges, throat spray, warm tea or water with lemon/honey, popsicles or ice, or OTC cold relief medicine for throat discomfort.   For congestion: take a daily anti-histamine like Zyrtec, Claritin, and a oral decongestant, such as pseudoephedrine.  You can also use Flonase 1-2 sprays in each nostril daily.   It is important to stay hydrated: drink plenty of fluids (water, gatorade/powerade/pedialyte, juices, or teas) to keep your throat moisturized and help further relieve irritation/discomfort.

## 2022-02-24 NOTE — ED Triage Notes (Signed)
Pt reports chest congestion and nasal congestion x 2 weeks. States she had a cold first and after the cold resolved the congestion started. Has been taking Mucinex and sinex nasal spray.

## 2022-02-24 NOTE — ED Provider Notes (Signed)
[facility]    CSN: [id number] Arrival date & time: 02/24/22  1613      History   Chief Complaint Chief Complaint  Patient presents with   Nasal Congestion   Chest congestion    HPI Dominique Black is a 51 y.o. female.   Patient presents with nasal congestion, rhinorrhea, chest congestion and a mild nonproductive cough for 2 weeks.  Endorses she feels her right her allergies have flared as she has had similar symptoms approximately 6 years ago.  Has attempted use of Mucinex and Synex nasal spray which has been minimally helpful.  Denies fever, chills, body aches, ear pain, sore throat, shortness of breath or wheezing.  Tolerating food and liquids.  History of GERD.   History reviewed. No pertinent past medical history.  Patient Active Problem List   Diagnosis Date Noted   EPIGASTRIC PAIN 10/10/2008   SORE THROAT 08/07/2008   GLOSSODYNIA 08/07/2008   HEARTBURN 08/07/2008    History reviewed. No pertinent surgical history.  OB History   No obstetric history on file.      Home Medications    Prior to Admission medications   Medication Sig Start Date End Date Taking? Authorizing Provider  acetaminophen (TYLENOL) 325 MG tablet Take 650 mg by mouth every 6 (six) hours as needed.    [provider]  albuterol (PROVENTIL HFA;VENTOLIN HFA) 108 (90 Base) MCG/ACT inhaler Inhale 2 puffs into the lungs every 4 (four) hours as needed for wheezing or shortness of breath (cough, shortness of breath or wheezing.). 09/04/15   Posey Boyer, MD  ibuprofen (ADVIL,MOTRIN) 800 MG tablet Take 1 tablet (800 mg total) by mouth 3 (three) times daily. 08/04/16   Fransico Meadow, PA-C  methocarbamol (ROBAXIN) 500 MG tablet Take 1 tablet (500 mg total) by mouth 2 (two) times daily. 08/04/16   Fransico Meadow, PA-C  montelukast (SINGULAIR) 10 MG tablet TAKE 1 TABLET (10 MG TOTAL) BY MOUTH AT BEDTIME. 09/30/16   Shawnee Knapp, MD  mupirocin ointment (BACTROBAN) 2 % Apply 1 application  topically 2 (two) times daily. 05/18/17   Joretta Bachelor, PA    Family History History reviewed. No pertinent family history.  Social History Social History   Tobacco Use   Smoking status: Never   Smokeless tobacco: Never  Substance Use Topics   Alcohol use: No   Drug use: No     Allergies   Doxycycline, Naproxen, and Penicillins   Review of Systems Review of Systems  Constitutional: Negative.   HENT:  Positive for congestion and rhinorrhea. Negative for dental problem, drooling, ear discharge, ear pain, facial swelling, hearing loss, mouth sores, nosebleeds, postnasal drip, sinus pressure, sinus pain, sneezing, sore throat, tinnitus, trouble swallowing and voice change.   Respiratory:  Positive for cough. Negative for apnea, choking, chest tightness, shortness of breath, wheezing and stridor.   Cardiovascular: Negative.   Gastrointestinal: Negative.   Skin: Negative.   Neurological: Negative.      Physical Exam Triage Vital Signs ED Triage Vitals [02/24/22 1751]  Enc Vitals Group     BP (!) 156/85     Pulse Rate 92     Resp 18     Temp 98.1 F (36.7 C)     Temp Source Oral     SpO2 99 %     Weight      Height      Head Circumference      Peak Flow  Pain Score 0     Pain Loc      Pain Edu?      Excl. in [place]?    No data found.  Updated Vital Signs BP (!) 156/85 (BP Location: Right Arm)   Pulse 92   Temp 98.1 F (36.7 C) (Oral)   Resp 18   SpO2 99%   Visual Acuity Right Eye Distance:   Left Eye Distance:   Bilateral Distance:    Right Eye Near:   Left Eye Near:    Bilateral Near:     Physical Exam Constitutional:      Appearance: Normal appearance.  HENT:     Right Ear: Tympanic membrane, ear canal and external ear normal.     Left Ear: Tympanic membrane, ear canal and external ear normal.     Nose: Congestion and rhinorrhea present.     Mouth/Throat:     Mouth: Mucous membranes are moist.     Pharynx: Oropharynx is clear.  Eyes:      Extraocular Movements: Extraocular movements intact.  Cardiovascular:     Rate and Rhythm: Normal rate and regular rhythm.     Pulses: Normal pulses.     Heart sounds: Normal heart sounds.  Pulmonary:     Effort: Pulmonary effort is normal.     Breath sounds: Normal breath sounds.  Skin:    General: Skin is warm and dry.  Neurological:     Mental Status: She is alert and oriented to person, place, and time. Mental status is at baseline.  Psychiatric:        Mood and Affect: Mood normal.        Behavior: Behavior normal.      UC Treatments / Results  Labs (all labs ordered are listed, but only abnormal results are displayed) Labs Reviewed - No data to display  EKG   Radiology No results found.  Procedures Procedures (including critical care time)  Medications Ordered in UC Medications - No data to display  Initial Impression / Assessment and Plan / UC Course  I have reviewed the triage vital signs and the nursing notes.  Pertinent labs & imaging results that were available during my care of the patient were reviewed by me and considered in my medical decision making (see chart for details).  Seasonal allergies  Vital signs are stable and patient is in no signs of distress nor toxic appearing, congestion is noted with thin the nasal turbinates, exam otherwise unremarkable, lungs are clear and O2 saturation is 99% on room air, symptoms have been present for 2 weeks and we are in peak cold and flu season we will provide bacterial coverage, Z-Pak prescribed as well as prescribing cetirizine and albuterol inhaler as she endorses success with this in the past, may continue use of over-the-counter medications for additional support with follow-up as needed Final Clinical Impressions(s) / UC Diagnoses   Final diagnoses:  None   Discharge Instructions   None    ED Prescriptions   None    PDMP not reviewed this encounter.   Hans Eden, NP 02/24/22 1812

## 2022-08-16 DIAGNOSIS — Z1321 Encounter for screening for nutritional disorder: Secondary | ICD-10-CM | POA: Diagnosis not present

## 2022-08-16 DIAGNOSIS — Z124 Encounter for screening for malignant neoplasm of cervix: Secondary | ICD-10-CM | POA: Diagnosis not present

## 2022-08-16 DIAGNOSIS — Z113 Encounter for screening for infections with a predominantly sexual mode of transmission: Secondary | ICD-10-CM | POA: Diagnosis not present

## 2022-08-16 DIAGNOSIS — Z01419 Encounter for gynecological examination (general) (routine) without abnormal findings: Secondary | ICD-10-CM | POA: Diagnosis not present

## 2022-08-16 DIAGNOSIS — Z01411 Encounter for gynecological examination (general) (routine) with abnormal findings: Secondary | ICD-10-CM | POA: Diagnosis not present

## 2023-03-12 ENCOUNTER — Ambulatory Visit (HOSPITAL_COMMUNITY)
Admission: EM | Admit: 2023-03-12 | Discharge: 2023-03-12 | Disposition: A | Discharge status: Home / Self Care | Payer: 59 | Attending: Emergency Medicine | Admitting: Emergency Medicine

## 2023-03-12 ENCOUNTER — Encounter (HOSPITAL_COMMUNITY): Payer: Self-pay | Admitting: Emergency Medicine

## 2023-03-12 DIAGNOSIS — J029 Acute pharyngitis, unspecified: Secondary | ICD-10-CM

## 2023-03-12 LAB — POCT RAPID STREP A (OFFICE): Rapid Strep A Screen: NEGATIVE

## 2023-03-12 MED ORDER — LIDOCAINE VISCOUS HCL 2 % MT SOLN: 15.0000 mL | OROMUCOSAL | 0 refills | Status: DC | PRN

## 2023-03-12 NOTE — Discharge Instructions (Addendum)
The strep test was negative I believe you have a virus, which is treated with symptomatic care Continue tylenol every 4-6 hours Drink lots of fluids Try salt water gargles, throat lozenges, or throat spray The lidocaine gargle and spit can be used as often as needed to numb the throat It might take a few more days for symptoms to resolve

## 2023-03-12 NOTE — ED Provider Notes (Signed)
[facility]    CSN: [id number] Arrival date & time: 03/12/23  1002      History   Chief Complaint Chief Complaint  Patient presents with   Sore Throat    HPI Lisabeth Mian is a 52 y.o. female.  About a week of sore throat with worsening symptom in the last 3 days. Rating pain 8/10 with swallowing Not having fever, congestion, cough Tried tylenol for a few days. Avoids NSAIDs due to stomach upset Unknown sick contacts at work  History reviewed. No pertinent past medical history.  Patient Active Problem List   Diagnosis Date Noted   EPIGASTRIC PAIN 10/10/2008   SORE THROAT 08/07/2008   GLOSSODYNIA 08/07/2008   HEARTBURN 08/07/2008    History reviewed. No pertinent surgical history.  OB History   No obstetric history on file.      Home Medications    Prior to Admission medications   Medication Sig Start Date End Date Taking? Authorizing Provider  lidocaine (XYLOCAINE) 2 % solution Use as directed 15 mLs in the mouth or throat every 3 (three) hours as needed for mouth pain. Swish/gargle and spit out 03/12/23  Yes Darriona Dehaas, Lurena Joiner, PA-C  acetaminophen (TYLENOL) 325 MG tablet Take 650 mg by mouth every 6 (six) hours as needed.    [provider]  albuterol (VENTOLIN HFA) 108 (90 Base) MCG/ACT inhaler Inhale 2 puffs into the lungs every 4 (four) hours as needed for wheezing or shortness of breath (cough, shortness of breath or wheezing.). 02/24/22   White, Elita Boone, NP  cetirizine (ZYRTEC ALLERGY) 10 MG tablet Take 1 tablet (10 mg total) by mouth daily. 02/24/22   White, Elita Boone, NP  montelukast (SINGULAIR) 10 MG tablet TAKE 1 TABLET (10 MG TOTAL) BY MOUTH AT BEDTIME. 09/30/16   Sherren Mocha, MD  mupirocin ointment (BACTROBAN) 2 % Apply 1 application topically 2 (two) times daily. 05/18/17   Garnetta Buddy, PA    Family History History reviewed. No pertinent family history.  Social History Social History   Tobacco Use   Smoking status: Never    Smokeless tobacco: Never  Substance Use Topics   Alcohol use: No   Drug use: No     Allergies   Doxycycline, Naproxen, and Penicillins   Review of Systems Review of Systems Per HPI  Physical Exam Triage Vital Signs ED Triage Vitals  Encounter Vitals Group     BP 03/12/23 1019 135/83     Systolic BP Percentile --      Diastolic BP Percentile --      Pulse Rate 03/12/23 1019 79     Resp 03/12/23 1019 16     Temp 03/12/23 1019 98.2 F (36.8 C)     Temp Source 03/12/23 1019 Oral     SpO2 03/12/23 1019 98 %     Weight --      Height --      Head Circumference --      Peak Flow --      Pain Score 03/12/23 1017 8     Pain Loc --      Pain Education --      Exclude from Growth Chart --    No data found.  Updated Vital Signs BP 135/83 (BP Location: Left Arm)   Pulse 79   Temp 98.2 F (36.8 C) (Oral)   Resp 16   LMP 07/21/2016   SpO2 98%   Visual Acuity Right Eye Distance:   Left Eye Distance:  Bilateral Distance:    Right Eye Near:   Left Eye Near:    Bilateral Near:     Physical Exam Vitals and nursing note reviewed.  Constitutional:      General: She is not in acute distress.    Appearance: She is not ill-appearing.  HENT:     Right Ear: Tympanic membrane and ear canal normal.     Left Ear: Tympanic membrane and ear canal normal.     Nose: No congestion or rhinorrhea.     Mouth/Throat:     Mouth: Mucous membranes are moist.     Pharynx: Oropharynx is clear. Uvula midline. Posterior oropharyngeal erythema (mild) present. No pharyngeal swelling or oropharyngeal exudate.     Comments: No tonsils  Cardiovascular:     Rate and Rhythm: Normal rate and regular rhythm.     Heart sounds: Normal heart sounds.  Pulmonary:     Effort: Pulmonary effort is normal.     Breath sounds: Normal breath sounds.  Abdominal:     Palpations: Abdomen is soft.     Tenderness: There is no abdominal tenderness.  Musculoskeletal:     Cervical back: Normal range of motion.  No rigidity.  Lymphadenopathy:     Cervical: No cervical adenopathy.  Skin:    General: Skin is warm and dry.  Neurological:     Mental Status: She is alert and oriented to person, place, and time.     UC Treatments / Results  Labs (all labs ordered are listed, but only abnormal results are displayed) Labs Reviewed  POCT RAPID STREP A (OFFICE)    EKG   Radiology No results found.  Procedures Procedures (including critical care time)  Medications Ordered in UC Medications - No data to display  Initial Impression / Assessment and Plan / UC Course  I have reviewed the triage vital signs and the nursing notes.  Pertinent labs & imaging results that were available during my care of the patient were reviewed by me and considered in my medical decision making (see chart for details).  Afebrile, well appearing. Rapid strep negative. Discussed viral etiology, symptomatic care. Continue tylenol, can try lidocaine gargle, lozenges, etc. Return precautions. Patient agrees to plan   Final Clinical Impressions(s) / UC Diagnoses   Final diagnoses:  Viral pharyngitis     Discharge Instructions      The strep test was negative I believe you have a virus, which is treated with symptomatic care Continue tylenol every 4-6 hours Drink lots of fluids Try salt water gargles, throat lozenges, or throat spray The lidocaine gargle and spit can be used as often as needed to numb the throat It might take a few more days for symptoms to resolve     ED Prescriptions     Medication Sig Dispense Auth. Provider   lidocaine (XYLOCAINE) 2 % solution Use as directed 15 mLs in the mouth or throat every 3 (three) hours as needed for mouth pain. Swish/gargle and spit out 100 mL Billy Turvey, Lurena Joiner, PA-C      PDMP not reviewed this encounter.   Brynden Thune, Lurena Joiner, [place] 03/12/23 1048

## 2023-03-12 NOTE — ED Triage Notes (Signed)
Pt c/o sore throat for 1 week that has gotten worse in the last 3 days. States her throat is red and swollen. Denies any other symptoms

## 2023-06-01 ENCOUNTER — Ambulatory Visit (HOSPITAL_COMMUNITY)
Admission: EM | Admit: 2023-06-01 | Discharge: 2023-06-01 | Disposition: A | Discharge status: Home / Self Care | Payer: 59 | Attending: Emergency Medicine | Admitting: Emergency Medicine

## 2023-06-01 ENCOUNTER — Encounter (HOSPITAL_COMMUNITY): Payer: Self-pay

## 2023-06-01 DIAGNOSIS — H66002 Acute suppurative otitis media without spontaneous rupture of ear drum, left ear: Secondary | ICD-10-CM | POA: Diagnosis not present

## 2023-06-01 MED ORDER — LEVOFLOXACIN 500 MG PO TABS: 500.0000 mg | ORAL_TABLET | Freq: Every day | ORAL | 0 refills | Status: AC

## 2023-06-01 NOTE — Discharge Instructions (Signed)
Start taking levofloxacin once daily for 7 days for ear infection.  You can alternate between Tylenol and ibuprofen every 4-6 hours as needed for pain.  Return here if symptoms persist.

## 2023-06-01 NOTE — ED Triage Notes (Signed)
Pt c/o lt earache 2 wks ago, took tylenol and went away. States lt earache is back with some pain to rt ear. C/o swelling under lt ear/neck area. States tylenol isn't helping now.

## 2023-06-01 NOTE — ED Provider Notes (Signed)
[facility]    CSN: [id number] Arrival date & time: 06/01/23  0809      History   Chief Complaint Chief Complaint  Patient presents with   Otalgia    HPI Dominique Black is a 53 y.o. female.   Patient presents with left ear pain x 5 weeks.  Patient states that she originally took Tylenol and the pain went away.  Patient states the ear pain came back about 1 week ago.  Patient has also noticed some swelling to neck.  Patient states Tylenol is providing minimal relief for pain.  Denies fever, headache, dizziness, nausea, and vomiting.   Otalgia Associated symptoms: congestion   Associated symptoms: no cough, no fever, no headaches and no vomiting     History reviewed. No pertinent past medical history.  Patient Active Problem List   Diagnosis Date Noted   EPIGASTRIC PAIN 10/10/2008   SORE THROAT 08/07/2008   GLOSSODYNIA 08/07/2008   HEARTBURN 08/07/2008    History reviewed. No pertinent surgical history.  OB History   No obstetric history on file.      Home Medications    Prior to Admission medications   Medication Sig Start Date End Date Taking? Authorizing Provider  levofloxacin (LEVAQUIN) 500 MG tablet Take 1 tablet (500 mg total) by mouth daily for 7 days. 06/01/23 06/08/23 Yes Wynonia Lawman A, NP  acetaminophen (TYLENOL) 325 MG tablet Take 650 mg by mouth every 6 (six) hours as needed.    [provider]    Family History History reviewed. No pertinent family history.  Social History Social History   Tobacco Use   Smoking status: Never   Smokeless tobacco: Never  Substance Use Topics   Alcohol use: No   Drug use: No     Allergies   Doxycycline, Naproxen, and Penicillins   Review of Systems Review of Systems  Constitutional:  Negative for chills, fatigue and fever.  HENT:  Positive for congestion and ear pain.   Respiratory:  Negative for cough.   Gastrointestinal:  Negative for nausea and vomiting.  Neurological:  Negative  for dizziness and headaches.     Physical Exam Triage Vital Signs ED Triage Vitals  Encounter Vitals Group     BP 06/01/23 0823 (!) 161/89     Systolic BP Percentile --      Diastolic BP Percentile --      Pulse Rate 06/01/23 0823 (!) 112     Resp 06/01/23 0823 18     Temp 06/01/23 0823 97.6 F (36.4 C)     Temp Source 06/01/23 0823 Oral     SpO2 06/01/23 0823 98 %     Weight --      Height --      Head Circumference --      Peak Flow --      Pain Score 06/01/23 0824 8     Pain Loc --      Pain Education --      Exclude from Growth Chart --    No data found.  Updated Vital Signs BP (!) 161/89 (BP Location: Right Arm)   Pulse (!) 112   Temp 97.6 F (36.4 C) (Oral)   Resp 18   LMP 07/21/2016   SpO2 98%   Visual Acuity Right Eye Distance:   Left Eye Distance:   Bilateral Distance:    Right Eye Near:   Left Eye Near:    Bilateral Near:     Physical Exam Vitals and  nursing note reviewed.  Constitutional:      General: She is awake. She is not in acute distress.    Appearance: Normal appearance. She is well-developed and well-groomed. She is not ill-appearing.  HENT:     Right Ear: Tympanic membrane, ear canal and external ear normal.     Left Ear: Tympanic membrane is erythematous and bulging.     Nose: Congestion and rhinorrhea present.     Mouth/Throat:     Mouth: Mucous membranes are moist.     Pharynx: Posterior oropharyngeal erythema and postnasal drip present.     Tonsils: No tonsillar exudate.  Lymphadenopathy:     Cervical: Cervical adenopathy present.     Right cervical: No superficial cervical adenopathy.    Left cervical: Superficial cervical adenopathy present.  Skin:    General: Skin is warm and dry.  Neurological:     Mental Status: She is alert.  Psychiatric:        Behavior: Behavior is cooperative.      UC Treatments / Results  Labs (all labs ordered are listed, but only abnormal results are displayed) Labs Reviewed - No data to  display  EKG   Radiology No results found.  Procedures Procedures (including critical care time)  Medications Ordered in UC Medications - No data to display  Initial Impression / Assessment and Plan / UC Course  I have reviewed the triage vital signs and the nursing notes.  Pertinent labs & imaging results that were available during my care of the patient were reviewed by me and considered in my medical decision making (see chart for details).     Patient presented with left ear pain x 5 weeks.  Patient states ear pain was originally relieved by Tylenol, but states the pain came back about 1 week ago.  Patient also reports noticing some swelling to neck.  Denies any other symptoms.  Upon assessment patient has left erythematous and bulging TM.  Congestion and rhinorrhea are present.  Moderate left-sided professional cervical adenopathy noted.  Prescribed levofloxacin for otitis media due to penicillin and doxycycline allergy.  Discussed return precautions. Final Clinical Impressions(s) / UC Diagnoses   Final diagnoses:  Non-recurrent acute suppurative otitis media of left ear without spontaneous rupture of tympanic membrane     Discharge Instructions      Start taking levofloxacin once daily for 7 days for ear infection.  You can alternate between Tylenol and ibuprofen every 4-6 hours as needed for pain.  Return here if symptoms persist.    ED Prescriptions     Medication Sig Dispense Auth. Provider   levofloxacin (LEVAQUIN) 500 MG tablet Take 1 tablet (500 mg total) by mouth daily for 7 days. 7 tablet Wynonia Lawman A, NP      PDMP not reviewed this encounter.   Wynonia Lawman A, [place] 06/01/23 [phone]

## 2023-08-31 DIAGNOSIS — Z1331 Encounter for screening for depression: Secondary | ICD-10-CM | POA: Diagnosis not present

## 2023-08-31 DIAGNOSIS — Z01411 Encounter for gynecological examination (general) (routine) with abnormal findings: Secondary | ICD-10-CM | POA: Diagnosis not present

## 2023-08-31 DIAGNOSIS — Z113 Encounter for screening for infections with a predominantly sexual mode of transmission: Secondary | ICD-10-CM | POA: Diagnosis not present

## 2023-08-31 DIAGNOSIS — Z124 Encounter for screening for malignant neoplasm of cervix: Secondary | ICD-10-CM | POA: Diagnosis not present

## 2023-08-31 DIAGNOSIS — Z01419 Encounter for gynecological examination (general) (routine) without abnormal findings: Secondary | ICD-10-CM | POA: Diagnosis not present

## 2023-08-31 DIAGNOSIS — Z1231 Encounter for screening mammogram for malignant neoplasm of breast: Secondary | ICD-10-CM | POA: Diagnosis not present

## 2023-09-12 DIAGNOSIS — N939 Abnormal uterine and vaginal bleeding, unspecified: Secondary | ICD-10-CM | POA: Diagnosis not present

## 2023-09-14 DIAGNOSIS — Z131 Encounter for screening for diabetes mellitus: Secondary | ICD-10-CM | POA: Diagnosis not present

## 2023-09-14 DIAGNOSIS — Z0000 Encounter for general adult medical examination without abnormal findings: Secondary | ICD-10-CM | POA: Diagnosis not present

## 2023-09-14 DIAGNOSIS — Z13220 Encounter for screening for lipoid disorders: Secondary | ICD-10-CM | POA: Diagnosis not present

## 2023-09-14 DIAGNOSIS — Z1329 Encounter for screening for other suspected endocrine disorder: Secondary | ICD-10-CM | POA: Diagnosis not present
